# Patient Record
Sex: Female | Born: 1990 | Race: White | Hispanic: No | Marital: Married | Smoking: Never smoker
Health system: Southern US, Community
[De-identification: ages and names within clinical notes are randomized; demographics above are authoritative.]

## PROBLEM LIST (undated history)

## (undated) DIAGNOSIS — I319 Disease of pericardium, unspecified: Secondary | ICD-10-CM

## (undated) DIAGNOSIS — F329 Major depressive disorder, single episode, unspecified: Secondary | ICD-10-CM

## (undated) DIAGNOSIS — R630 Anorexia: Secondary | ICD-10-CM

## (undated) DIAGNOSIS — D649 Anemia, unspecified: Secondary | ICD-10-CM

## (undated) DIAGNOSIS — N912 Amenorrhea, unspecified: Secondary | ICD-10-CM

## (undated) DIAGNOSIS — N946 Dysmenorrhea, unspecified: Secondary | ICD-10-CM

## (undated) DIAGNOSIS — F419 Anxiety disorder, unspecified: Secondary | ICD-10-CM

## (undated) DIAGNOSIS — R011 Cardiac murmur, unspecified: Secondary | ICD-10-CM

## (undated) DIAGNOSIS — N941 Unspecified dyspareunia: Secondary | ICD-10-CM

## (undated) DIAGNOSIS — F32A Depression, unspecified: Secondary | ICD-10-CM

## (undated) HISTORY — DX: Cardiac murmur, unspecified: R01.1

## (undated) HISTORY — DX: Anxiety disorder, unspecified: F41.9

## (undated) HISTORY — DX: Amenorrhea, unspecified: N91.2

## (undated) HISTORY — DX: Major depressive disorder, single episode, unspecified: F32.9

## (undated) HISTORY — DX: Depression, unspecified: F32.A

## (undated) HISTORY — DX: Anorexia: R63.0

## (undated) HISTORY — DX: Dysmenorrhea, unspecified: N94.6

## (undated) HISTORY — DX: Disease of pericardium, unspecified: I31.9

## (undated) HISTORY — DX: Anemia, unspecified: D64.9

## (undated) HISTORY — DX: Unspecified dyspareunia: N94.10

## (undated) HISTORY — PX: DILATION AND CURETTAGE OF UTERUS: SHX78

---

## 2017-03-02 ENCOUNTER — Other Ambulatory Visit (HOSPITAL_COMMUNITY)
Admission: RE | Admit: 2017-03-02 | Discharge: 2017-03-02 | Disposition: A | Discharge status: Home / Self Care | Payer: 59 | Source: Ambulatory Visit | Attending: Obstetrics and Gynecology | Admitting: Obstetrics and Gynecology

## 2017-03-02 ENCOUNTER — Ambulatory Visit (INDEPENDENT_AMBULATORY_CARE_PROVIDER_SITE_OTHER): Payer: 59 | Admitting: Obstetrics and Gynecology

## 2017-03-02 ENCOUNTER — Encounter: Payer: Self-pay | Admitting: Obstetrics and Gynecology

## 2017-03-02 VITALS — BP 110/72 | HR 76 | Resp 16 | Ht 64.0 in | Wt 141.0 lb

## 2017-03-02 DIAGNOSIS — Z01419 Encounter for gynecological examination (general) (routine) without abnormal findings: Secondary | ICD-10-CM

## 2017-03-02 DIAGNOSIS — Z113 Encounter for screening for infections with a predominantly sexual mode of transmission: Secondary | ICD-10-CM | POA: Insufficient documentation

## 2017-03-02 DIAGNOSIS — Z124 Encounter for screening for malignant neoplasm of cervix: Secondary | ICD-10-CM | POA: Diagnosis present

## 2017-03-02 DIAGNOSIS — Z0000 Encounter for general adult medical examination without abnormal findings: Secondary | ICD-10-CM

## 2017-03-02 DIAGNOSIS — Z3009 Encounter for other general counseling and advice on contraception: Secondary | ICD-10-CM

## 2017-03-02 NOTE — Patient Instructions (Signed)
EXERCISE AND DIET:  We recommended that you start or continue a regular exercise program for good health. Regular exercise means any activity that makes your heart beat faster and makes you sweat.  We recommend exercising at least 30 minutes per day at least 3 days a week, preferably 4 or 5.  We also recommend a diet low in fat and sugar.  Inactivity, poor dietary choices and obesity can cause diabetes, heart attack, stroke, and kidney damage, among others.    ALCOHOL AND SMOKING:  Women should limit their alcohol intake to no more than 7 drinks/beers/glasses of wine (combined, not each!) per week. Moderation of alcohol intake to this level decreases your risk of breast cancer and liver damage. And of course, no recreational drugs are part of a healthy lifestyle.  And absolutely no smoking or even second hand smoke. Most people know smoking can cause heart and lung diseases, but did you know it also contributes to weakening of your bones? Aging of your skin?  Yellowing of your teeth and nails?  CALCIUM AND VITAMIN D:  Adequate intake of calcium and Vitamin D are recommended.  The recommendations for exact amounts of these supplements seem to change often, but generally speaking 600 mg of calcium (either carbonate or citrate) and 800 units of Vitamin D per day seems prudent. Certain women may benefit from higher intake of Vitamin D.  If you are among these women, your doctor will have told you during your visit.    PAP SMEARS:  Pap smears, to check for cervical cancer or precancers,  have traditionally been done yearly, although recent scientific advances have shown that most women can have pap smears less often.  However, every woman still should have a physical exam from her gynecologist every year. It will include a breast check, inspection of the vulva and vagina to check for abnormal growths or skin changes, a visual exam of the cervix, and then an exam to evaluate the size and shape of the uterus and  ovaries.  And after 26 years of age, a rectal exam is indicated to check for rectal cancers. We will also provide age appropriate advice regarding health maintenance, like when you should have certain vaccines, screening for sexually transmitted diseases, bone density testing, colonoscopy, mammograms, etc.   MAMMOGRAMS:  All women over 40 years old should have a yearly mammogram. Many facilities now offer a "3D" mammogram, which may cost around $50 extra out of pocket. If possible,  we recommend you accept the option to have the 3D mammogram performed.  It both reduces the number of women who will be called back for extra views which then turn out to be normal, and it is better than the routine mammogram at detecting truly abnormal areas.    COLONOSCOPY:  Colonoscopy to screen for colon cancer is recommended for all women at age 50.  We know, you hate the idea of the prep.  We agree, BUT, having colon cancer and not knowing it is worse!!  Colon cancer so often starts as a polyp that can be seen and removed at colonscopy, which can quite literally save your life!  And if your first colonoscopy is normal and you have no family history of colon cancer, most women don't have to have it again for 10 years.  Once every ten years, you can do something that may end up saving your life, right?  We will be happy to help you get it scheduled when you are ready.    Be sure to check your insurance coverage so you understand how much it will cost.  It may be covered as a preventative service at no cost, but you should check your particular policy.      Breast Self-Awareness Breast self-awareness means being familiar with how your breasts look and feel. It involves checking your breasts regularly and reporting any changes to your health care provider. Practicing breast self-awareness is important. A change in your breasts can be a sign of a serious medical problem. Being familiar with how your breasts look and feel allows  you to find any problems early, when treatment is more likely to be successful. All women should practice breast self-awareness, including women who have had breast implants. How to do a breast self-exam One way to learn what is normal for your breasts and whether your breasts are changing is to do a breast self-exam. To do a breast self-exam: Look for Changes  1. Remove all the clothing above your waist. 2. Stand in front of a mirror in a room with good lighting. 3. Put your hands on your hips. 4. Push your hands firmly downward. 5. Compare your breasts in the mirror. Look for differences between them (asymmetry), such as: ? Differences in shape. ? Differences in size. ? Puckers, dips, and bumps in one breast and not the other. 6. Look at each breast for changes in your skin, such as: ? Redness. ? Scaly areas. 7. Look for changes in your nipples, such as: ? Discharge. ? Bleeding. ? Dimpling. ? Redness. ? A change in position. Feel for Changes  Carefully feel your breasts for lumps and changes. It is best to do this while lying on your back on the floor and again while sitting or standing in the shower or tub with soapy water on your skin. Feel each breast in the following way:  Place the arm on the side of the breast you are examining above your head.  Feel your breast with the other hand.  Start in the nipple area and make  inch (2 cm) overlapping circles to feel your breast. Use the pads of your three middle fingers to do this. Apply light pressure, then medium pressure, then firm pressure. The light pressure will allow you to feel the tissue closest to the skin. The medium pressure will allow you to feel the tissue that is a little deeper. The firm pressure will allow you to feel the tissue close to the ribs.  Continue the overlapping circles, moving downward over the breast until you feel your ribs below your breast.  Move one finger-width toward the center of the body.  Continue to use the  inch (2 cm) overlapping circles to feel your breast as you move slowly up toward your collarbone.  Continue the up and down exam using all three pressures until you reach your armpit.  Write Down What You Find  Write down what is normal for each breast and any changes that you find. Keep a written record with breast changes or normal findings for each breast. By writing this information down, you do not need to depend only on memory for size, tenderness, or location. Write down where you are in your menstrual cycle, if you are still menstruating. If you are having trouble noticing differences in your breasts, do not get discouraged. With time you will become more familiar with the variations in your breasts and more comfortable with the exam. How often should I examine my breasts? Examine   your breasts every month. If you are breastfeeding, the best time to examine your breasts is after a feeding or after using a breast pump. If you menstruate, the best time to examine your breasts is 5-7 days after your period is over. During your period, your breasts are lumpier, and it may be more difficult to notice changes. When should I see my health care provider? See your health care provider if you notice:  A change in shape or size of your breasts or nipples.  A change in the skin of your breast or nipples, such as a reddened or scaly area.  Unusual discharge from your nipples.  A lump or thick area that was not there before.  Pain in your breasts.  Anything that concerns you.  This information is not intended to replace advice given to you by your health care provider. Make sure you discuss any questions you have with your health care provider. Document Released: 05/26/2005 Document Revised: 11/01/2015 Document Reviewed: 04/15/2015 Elsevier Interactive Patient Education  2018 Elsevier Inc.  

## 2017-03-02 NOTE — Progress Notes (Signed)
26 y.o. G1P0010 MarriedCaucasianF here for annual exam.  No dyspareunia. Wants to avoid hormones, tried the copper IUD and hated it.  Period Cycle (Days): 29 Period Duration (Days): 6 Period Pattern: Regular Menstrual Flow: Light Menstrual Control: Thin pad, Other (Comment) (menstrual cup) Menstrual Control Change Freq (Hours): 3-4 Dysmenorrhea: (!) Mild Dysmenorrhea Symptoms: Cramping  Patient's last menstrual period was 02/16/2017.          Sexually active: Yes.    The current method of family planning is condoms sometimes and family planning.    Exercising: Yes.    walking and barre 3 Smoker:  no  Health Maintenance: Pap:  2015 done at [facility] in [place], [place] -- normal per patient History of abnormal Pap:  no MMG:  n/a Colonoscopy:  n/a BMD:   n/a TDaP:  2018 per patient Gardasil: no   reports that she has never smoked. She has never used smokeless tobacco. She reports that she does not drink alcohol or use drugs. Student at [employer], finishing in 12/18. Plans to get her masters. 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   Past Medical History:  Diagnosis Date  . Amenorrhea   . Anemia   . Anorexia   . Anxiety   . Depression   . Dysmenorrhea   . Dyspareunia, female   . Heart murmur     Past Surgical History:  Procedure Laterality Date  . DILATION AND CURETTAGE OF UTERUS      Current Outpatient Prescriptions  Medication Sig Dispense Refill  . FLUoxetine (PROZAC) 20 MG capsule Take 1 capsule by mouth daily.    . HydrOXYzine HCl 10 MG/5ML SOLN Take 1 tablet by mouth as needed.    . Multiple Vitamin (MULTIVITAMIN) tablet Take 1 tablet by mouth daily.     No current facility-administered medications for this visit.     Family History  Problem Relation Age of Onset  . Alcoholism Maternal Grandmother   . Alcoholism Maternal Grandfather     Review of Systems  Constitutional: Negative.   HENT: Negative.   Eyes: Negative.   Respiratory:  Negative.   Cardiovascular: Negative.   Gastrointestinal: Negative.   Endocrine: Negative.   Genitourinary: Negative.   Musculoskeletal: Negative.   Skin: Negative.   Allergic/Immunologic: Negative.   Neurological: Negative.   Hematological: Negative.   Psychiatric/Behavioral: Negative.     Exam:   BP 110/72 (BP Location: Right Arm, Patient Position: Sitting, Cuff Size: Normal)   Pulse 76   Resp 16   Ht  (1.626 m)   Wt 141 lb (64 kg)   LMP 02/16/2017   BMI 24.20 kg/m   Weight change: @ Height:   Height:  (162.6 cm)  Ht Readings from Last 3 Encounters:  03/02/17  (1.626 m)    General appearance: alert, cooperative and appears stated age Head: Normocephalic, without obvious abnormality, atraumatic Neck: no adenopathy, supple, symmetrical, trachea midline and thyroid normal to inspection and palpation Lungs: clear to auscultation bilaterally Cardiovascular: regular rate and rhythm Breasts: normal appearance, no masses or tenderness Abdomen: soft, non-tender; non distended,  no masses,  no organomegaly Extremities: extremities normal, atraumatic, no cyanosis or edema Skin: Skin color, texture, turgor normal. No rashes or lesions Lymph nodes: Cervical, supraclavicular, and axillary nodes normal. No abnormal inguinal nodes palpated Neurologic: Grossly normal   Pelvic: External genitalia:  no lesions              Urethra:  normal appearing urethra with  no masses, tenderness or lesions              Bartholins and Skenes: normal                 Vagina: normal appearing vagina with an increase in white watery d/c (denies symptoms)              Cervix: no lesions               Bimanual Exam:  Uterus:  normal size, contour, position, consistency, mobility, non-tender              Adnexa: no mass, fullness, tenderness               Rectovaginal: Confirms               Anus:  normal sphincter tone, no lesions  Chaperone was present for exam.  A:   Well Woman with normal exam  P:   Pap with reflex hpv  Desires STD testing  Condoms for contraception  Information on other IUD's given  Screening labs  Gardasil information given  Discussed breast self exam  Discussed calcium and vit D intake

## 2017-03-03 LAB — LIPID PANEL
CHOLESTEROL TOTAL: 181 mg/dL (ref 100–199)
Chol/HDL Ratio: 2.1 ratio (ref 0.0–4.4)
HDL: 85 mg/dL (ref >39)
LDL Calculated: 86 mg/dL (ref 0–99)
Triglycerides: 50 mg/dL (ref 0–149)
VLDL CHOLESTEROL CAL: 10 mg/dL (ref 5–40)

## 2017-03-03 LAB — CYTOLOGY - PAP
CHLAMYDIA, DNA PROBE: NEGATIVE
DIAGNOSIS: NEGATIVE
Neisseria Gonorrhea: NEGATIVE
Trichomonas: NEGATIVE

## 2017-03-03 LAB — CBC
HEMATOCRIT: 42.1 % (ref 34.0–46.6)
HEMOGLOBIN: 14.2 g/dL (ref 11.1–15.9)
MCH: 29.3 pg (ref 26.6–33.0)
MCHC: 33.7 g/dL (ref 31.5–35.7)
MCV: 87 fL (ref 79–97)
Platelets: 234 10*3/uL (ref 150–379)
RBC: 4.84 x10E6/uL (ref 3.77–5.28)
RDW: 13.3 % (ref 12.3–15.4)
WBC: 6.5 10*3/uL (ref 3.4–10.8)

## 2017-03-03 LAB — COMPREHENSIVE METABOLIC PANEL
ALT: 22 IU/L (ref 0–32)
AST: 19 IU/L (ref 0–40)
Albumin/Globulin Ratio: 2 (ref 1.2–2.2)
Albumin: 4.7 g/dL (ref 3.5–5.5)
Alkaline Phosphatase: 66 IU/L (ref 39–117)
BILIRUBIN TOTAL: 0.3 mg/dL (ref 0.0–1.2)
BUN/Creatinine Ratio: 24 — ABNORMAL HIGH (ref 9–23)
BUN: 19 mg/dL (ref 6–20)
CHLORIDE: 102 mmol/L (ref 96–106)
CO2: 22 mmol/L (ref 20–29)
Calcium: 9.8 mg/dL (ref 8.7–10.2)
Creatinine, Ser: 0.79 mg/dL (ref 0.57–1.00)
GFR calc non Af Amer: 104 mL/min/{1.73_m2} (ref >59)
GFR, EST AFRICAN AMERICAN: 119 mL/min/{1.73_m2} (ref >59)
GLUCOSE: 74 mg/dL (ref 65–99)
Globulin, Total: 2.3 g/dL (ref 1.5–4.5)
Potassium: 4.2 mmol/L (ref 3.5–5.2)
Sodium: 139 mmol/L (ref 134–144)
TOTAL PROTEIN: 7 g/dL (ref 6.0–8.5)

## 2017-03-03 LAB — HEPATITIS C ANTIBODY: Hep C Virus Ab: <0.1 s/co ratio (ref 0.0–0.9)

## 2017-03-03 LAB — HEP, RPR, HIV PANEL
HEP B S AG: NEGATIVE
HIV Screen 4th Generation wRfx: NONREACTIVE
RPR Ser Ql: NONREACTIVE

## 2017-05-06 ENCOUNTER — Ambulatory Visit: Payer: 59 | Admitting: Certified Nurse Midwife

## 2017-05-06 ENCOUNTER — Other Ambulatory Visit: Payer: Self-pay

## 2017-05-06 ENCOUNTER — Encounter: Payer: Self-pay | Admitting: Certified Nurse Midwife

## 2017-05-06 VITALS — BP 100/62 | HR 64 | Resp 16 | Ht 64.0 in | Wt 144.0 lb

## 2017-05-06 DIAGNOSIS — Z01419 Encounter for gynecological examination (general) (routine) without abnormal findings: Secondary | ICD-10-CM

## 2017-05-06 DIAGNOSIS — B9689 Other specified bacterial agents as the cause of diseases classified elsewhere: Secondary | ICD-10-CM

## 2017-05-06 DIAGNOSIS — N760 Acute vaginitis: Secondary | ICD-10-CM

## 2017-05-06 MED ORDER — METRONIDAZOLE 0.75 % VA GEL: 1.0000 | Freq: Every day | VAGINAL | 0 refills | Status: DC

## 2017-05-06 NOTE — Patient Instructions (Signed)

## 2017-05-06 NOTE — Progress Notes (Signed)
26 y.o. Married Caucasian female G1P0010 here with complaint of vaginal symptoms of strong odor with no increase discharge. Describes discharge as white, no change.. Onset of symptoms 4 days ago. Has been more sexually active and noted after also. Denies new personal products except new underwear and no issues with. No  STD concerns. Urinary symptoms none . Contraception is NFP and condoms.  ROS Pertinent to HPI  O:Healthy female WDWN Affect: normal, orientation x 3  Exam: Skin: warm and dry Abdomen:soft non tender  Inguinal Lymph nodes: no enlargement or tenderness Pelvic exam: External genital: normal female  No lesions, scaling or exudate BUS: negative Vagina: white watery odorous discharge noted. Ph:4.5   ,Wet prep taken,  Cervix: normal, non tender, no CMT Uterus: normal, non tender Adnexa:normal, non tender, no masses or fullness noted   Wet Prep results: KOH, Saline + clue cells TNTC   A:Normal pelvic exam Contraception NFP/condoms BV   P:Discussed findings of normal pelvic exam. Discussed BV finding and etiology. Questions addressed and need for treatment due to being symptomatic. Discussed Aveeno or baking soda sitz bath for comfort. If working out in gym clothes  for long periods of time change underwear if possible. Coconut Oil use for skin protection prior to activity can be used to external skin for protection or dryness. Rx: Metrogel see order with instructions  Rv prn or if symptoms continue

## 2017-11-10 ENCOUNTER — Encounter: Payer: Self-pay | Admitting: Certified Nurse Midwife

## 2017-11-11 ENCOUNTER — Telehealth: Payer: Self-pay | Admitting: Certified Nurse Midwife

## 2017-11-11 NOTE — Telephone Encounter (Signed)
Spoke with patient, request OV to discuss IUD options. Current contraceptive is natural family planning. Patient request to schedule on a Friday. OV scheduled for 11/13/17 at 4pm with Dr. Oscar LaJertson.   Routing to provider for final review. Patient is agreeable to disposition. Will close encounter.

## 2017-11-11 NOTE — Telephone Encounter (Signed)
Patient would like to discuss iud options.

## 2017-11-12 NOTE — Progress Notes (Signed)
GYNECOLOGY  VISIT   HPI: 27 y.o.   Married  Caucasian  female   G1P0010 with Patient's last menstrual period was 11/11/2017.   here to discuss IUD options. She previously tried the copper IUD and hated it. Has wanted to avoid hormones. Cycles are monthly x 6 days, moderate flow, moderate cramps. Recently with stress her cycle was 35 days. Currently using natural family planning for contraception.   She is starting graduate school and doesn't want the worry of counting her cycles. She and her husband don't want children.    GYNECOLOGIC HISTORY: Patient's last menstrual period was 11/11/2017. Contraception:Family planning Menopausal hormone therapy: none        OB History    Gravida  1   Para  0   Term  0   Preterm  0   AB  1   Living  0     SAB  0   TAB  0   Ectopic  0   Multiple  0   Live Births  0              There are no active problems to display for this patient.   Past Medical History:  Diagnosis Date  . Amenorrhea   . Anemia   . Anorexia   . Anxiety   . Depression   . Dysmenorrhea   . Dyspareunia, female   . Heart murmur     Past Surgical History:  Procedure Laterality Date  . DILATION AND CURETTAGE OF UTERUS      Current Outpatient Medications  Medication Sig Dispense Refill  . FLUoxetine (PROZAC) 20 MG capsule Take 1 capsule by mouth daily.    . HydrOXYzine HCl 10 MG/5ML SOLN Take 1 tablet by mouth as needed.    . Multiple Vitamin (MULTIVITAMIN) tablet Take 1 tablet by mouth daily.     No current facility-administered medications for this visit.      ALLERGIES: Patient has no known allergies.  Family History  Problem Relation Age of Onset  . Alcoholism Maternal Grandmother   . Alcoholism Maternal Grandfather     Social History   Socioeconomic History  . Marital status: Married    Spouse name: Not on file  . Number of children: Not on file  . Years of education: Not on file  . Highest education level: Not on file   Occupational History  . Not on file  Social Needs  . Financial resource strain: Not on file  . Food insecurity:    Worry: Not on file    Inability: Not on file  . Transportation needs:    Medical: Not on file    Non-medical: Not on file  Tobacco Use  . Smoking status: Never Smoker  . Smokeless tobacco: Never Used  Substance and Sexual Activity  . Alcohol use: No  . Drug use: No  . Sexual activity: Yes    Partners: Male    Comment: condoms occ  Lifestyle  . Physical activity:    Days per week: Not on file    Minutes per session: Not on file  . Stress: Not on file  Relationships  . Social connections:    Talks on phone: Not on file    Gets together: Not on file    Attends religious service: Not on file    Active member of club or organization: Not on file    Attends meetings of clubs or organizations: Not on file    Relationship status: Not  on file  . Intimate partner violence:    Fear of current or ex partner: Not on file    Emotionally abused: Not on file    Physically abused: Not on file    Forced sexual activity: Not on file  Other Topics Concern  . Not on file  Social History Narrative  . Not on file    Review of Systems  Constitutional: Negative.   HENT: Negative.   Eyes: Negative.   Respiratory: Negative.   Cardiovascular: Negative.   Gastrointestinal: Negative.   Genitourinary: Negative.   Musculoskeletal: Negative.   Skin:       Change in mole on left upper arm  Neurological: Negative.   Endo/Heme/Allergies: Bruises/bleeds easily.  Psychiatric/Behavioral: Negative.     PHYSICAL EXAMINATION:    BP 104/60 (BP Location: Right Arm, Patient Position: Sitting, Cuff Size: Normal)   Pulse (!) 56   Ht 5\' 4"  (1.626 m)   Wt 147 lb 9.6 oz (67 kg)   LMP 11/11/2017   BMI 25.34 kg/m     General appearance: alert, cooperative and appears stated age  ASSESSMENT Contraception management, didn't like the paragard, wants to minimize hormones     PLAN Discussed options of different IUD's, she is interested in Malaysiathe kyleena Discussed risks of insertion, side effects Will return on 6/11 for insertion (LMP 6/519)   An After Visit Summary was printed and given to the patient.  ~15 minutes face to face time of which over 50% was spent in counseling.

## 2017-11-13 ENCOUNTER — Ambulatory Visit: Payer: 59 | Admitting: Obstetrics and Gynecology

## 2017-11-13 ENCOUNTER — Encounter: Payer: Self-pay | Admitting: Obstetrics and Gynecology

## 2017-11-13 ENCOUNTER — Other Ambulatory Visit: Payer: Self-pay

## 2017-11-13 VITALS — BP 104/60 | HR 56 | Ht 64.0 in | Wt 147.6 lb

## 2017-11-13 DIAGNOSIS — Z3009 Encounter for other general counseling and advice on contraception: Secondary | ICD-10-CM | POA: Diagnosis not present

## 2017-11-17 ENCOUNTER — Encounter: Payer: Self-pay | Admitting: Obstetrics and Gynecology

## 2017-11-17 ENCOUNTER — Other Ambulatory Visit: Payer: Self-pay

## 2017-11-17 ENCOUNTER — Ambulatory Visit (INDEPENDENT_AMBULATORY_CARE_PROVIDER_SITE_OTHER): Payer: 59 | Admitting: Obstetrics and Gynecology

## 2017-11-17 VITALS — BP 92/56 | HR 64 | Resp 14 | Wt 147.0 lb

## 2017-11-17 DIAGNOSIS — Z30430 Encounter for insertion of intrauterine contraceptive device: Secondary | ICD-10-CM | POA: Diagnosis not present

## 2017-11-17 DIAGNOSIS — Z113 Encounter for screening for infections with a predominantly sexual mode of transmission: Secondary | ICD-10-CM | POA: Diagnosis not present

## 2017-11-17 DIAGNOSIS — Z01812 Encounter for preprocedural laboratory examination: Secondary | ICD-10-CM | POA: Diagnosis not present

## 2017-11-17 DIAGNOSIS — Z3009 Encounter for other general counseling and advice on contraception: Secondary | ICD-10-CM

## 2017-11-17 LAB — POCT URINE PREGNANCY: Preg Test, Ur: NEGATIVE

## 2017-11-17 NOTE — Progress Notes (Signed)
GYNECOLOGY  VISIT   HPI: 27 y.o.   Married  Caucasian  female   G1P0010 with Patient's last menstrual period was 11/11/2017.   here for [facility] IUD insertion     GYNECOLOGIC HISTORY: Patient's last menstrual period was 11/11/2017. Contraception:none  Menopausal hormone therapy: none         OB History    Gravida  1   Para  0   Term  0   Preterm  0   AB  1   Living  0     SAB  0   TAB  0   Ectopic  0   Multiple  0   Live Births  0              There are no active problems to display for this patient.   Past Medical History:  Diagnosis Date  . Amenorrhea   . Anemia   . Anorexia   . Anxiety   . Depression   . Dysmenorrhea   . Dyspareunia, female   . Heart murmur     Past Surgical History:  Procedure Laterality Date  . DILATION AND CURETTAGE OF UTERUS      Current Outpatient Medications  Medication Sig Dispense Refill  . FLUoxetine (PROZAC) 20 MG capsule Take 1 capsule by mouth daily.    . HydrOXYzine HCl 10 MG/5ML SOLN Take 1 tablet by mouth as needed.    . Multiple Vitamin (MULTIVITAMIN) tablet Take 1 tablet by mouth daily.     No current facility-administered medications for this visit.      ALLERGIES: Patient has no known allergies.  Family History  Problem Relation Age of Onset  . Alcoholism Maternal Grandmother   . Alcoholism Maternal Grandfather     Social History   Socioeconomic History  . Marital status: Married    Spouse name: Not on file  . Number of children: Not on file  . Years of education: Not on file  . Highest education level: Not on file  Occupational History  . Not on file  Social Needs  . Financial resource strain: Not on file  . Food insecurity:    Worry: Not on file    Inability: Not on file  . Transportation needs:    Medical: Not on file    Non-medical: Not on file  Tobacco Use  . Smoking status: Never Smoker  . Smokeless tobacco: Never Used  Substance and Sexual Activity  . Alcohol use: No  .  Drug use: No  . Sexual activity: Yes    Partners: Male    Comment: condoms occ  Lifestyle  . Physical activity:    Days per week: Not on file    Minutes per session: Not on file  . Stress: Not on file  Relationships  . Social connections:    Talks on phone: Not on file    Gets together: Not on file    Attends religious service: Not on file    Active member of club or organization: Not on file    Attends meetings of clubs or organizations: Not on file    Relationship status: Not on file  . Intimate partner violence:    Fear of current or ex partner: Not on file    Emotionally abused: Not on file    Physically abused: Not on file    Forced sexual activity: Not on file  Other Topics Concern  . Not on file  Social History Narrative  . Not  on file    Review of Systems  Constitutional: Negative.   HENT: Negative.   Eyes: Negative.   Respiratory: Negative.   Cardiovascular: Negative.   Gastrointestinal: Negative.   Genitourinary: Negative.   Musculoskeletal: Negative.   Skin: Negative.   Neurological: Negative.   Endo/Heme/Allergies: Negative.   Psychiatric/Behavioral: Negative.     PHYSICAL EXAMINATION:    BP (!) 92/56 (BP Location: Right Arm, Patient Position: Sitting, Cuff Size: Normal)   Pulse 64   Resp 14   Wt 147 lb (66.7 kg)   LMP 11/11/2017   BMI 25.23 kg/m     General appearance: alert, cooperative and appears stated age  Pelvic: External genitalia:  no lesions              Urethra:  normal appearing urethra with no masses, tenderness or lesions              Bartholins and Skenes: normal                 Vagina: normal appearing vagina with normal color and discharge, no lesions              Cervix: no lesions              Bimanual Exam:  Uterus:  normal size, contour, position, consistency, mobility, non-tender and anteverted              Adnexa: no mass, fullness, tenderness               The risks of the kyleena IUD were reviewed with the patient,  including infection, abnormal bleeding and uterine perfortion. Consent was signed.  A speculum was placed in the vagina, the cervix was cleansed with betadine. A tenaculum was placed on the cervix, the uterus sounded to 7 cm. The cervix was dilated to a 4 hagar dilator  The kyleena IUD was inserted without difficulty. The string were cut to 3-4 cm. The tenaculum was removed. Slight oozing from the tenaculum site was stopped with pressure.   The patient tolerated the procedure well.    Chaperone was present for exam.  ASSESSMENT Contraception, Kyleena IUD insertion STD screening    PLAN F/U one month Genprobe sent   An After Visit Summary was printed and given to the patient.

## 2017-11-17 NOTE — Patient Instructions (Signed)
IUD Post-procedure Instructions . Cramping is common.  You may take Ibuprofen, Aleve, or Tylenol for the cramping.  This should resolve within 24 hours.   . You may have a small amount of spotting.  You should wear a mini pad for the next few days. . You may have intercourse in 24 hours. . You need to call the office if you have any pelvic pain, fever, heavy bleeding, or foul smelling vaginal discharge. . Shower or bathe as normal . Use a back up contraception for 1 week  

## 2017-11-18 LAB — GC/CHLAMYDIA PROBE AMP
Chlamydia trachomatis, NAA: NEGATIVE
Neisseria gonorrhoeae by PCR: NEGATIVE

## 2017-12-01 ENCOUNTER — Telehealth: Payer: Self-pay | Admitting: Obstetrics and Gynecology

## 2017-12-01 NOTE — Telephone Encounter (Signed)
Spoke with patient, requesting to reschedule IUD recheck on 01/01/18. Patient is aware Dr. Oscar LaJertson is out of the office on this day, request any available provider for 01/01/18. Patient declined to look at other available dates due to her limited schedule. Patient scheduled with Dr. Hyacinth MeekerMiller on 01/01/18 at 2:30pm.   Routing to provider for final review. Patient is agreeable to disposition. Will close encounter.  Cc: Dr. Hyacinth MeekerMiller

## 2017-12-01 NOTE — Telephone Encounter (Signed)
Patient canceled her upcoming 1 month IUD check 12/29/17. Patient is only available Friday 01/03/18 and Dr.Jertosn is not in the office that day. Patient is asking if she could see any provider who is available 01/03/18? 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

## 2017-12-01 NOTE — Telephone Encounter (Signed)
Left message to call Tabetha Haraway at [phone].  

## 2017-12-29 ENCOUNTER — Ambulatory Visit: Payer: 59 | Admitting: Obstetrics and Gynecology

## 2017-12-31 NOTE — Progress Notes (Signed)
GYNECOLOGY  VISIT   HPI: 27 y.o.   Married  Caucasian  female   [id number] with Patient's last menstrual period was 12/30/2017 (exact date).   here for   One month IUD recheck, she has a kyleena IUD. She has been spotting since insertion. Intermittently with bad cramps, can last all day. Cramps have been happening since insertion, less frequent, but still intense. No help with ibuprofen. Thinks she had a period 2 weeks ago and light flow a few days ago.  No dyspareunia.  She does c/o increasing fatigue since the IUD was placed, wondering if it is from the bleeding. No other thyroid c/o.  GYNECOLOGIC HISTORY: Patient's last menstrual period was 12/30/2017 (exact date). Contraception: Kyleena IUD  Menopausal hormone therapy: none         OB History    Gravida  1   Para  0   Term  0   Preterm  0   AB  1   Living  0     SAB  0   TAB  0   Ectopic  0   Multiple  0   Live Births  0              There are no active problems to display for this patient.   Past Medical History:  Diagnosis Date  . Amenorrhea   . Anemia   . Anorexia   . Anxiety   . Depression   . Dysmenorrhea   . Dyspareunia, female   . Heart murmur     Past Surgical History:  Procedure Laterality Date  . DILATION AND CURETTAGE OF UTERUS      Current Outpatient Medications  Medication Sig Dispense Refill  . FLUoxetine (PROZAC) 20 MG capsule Take 1 capsule by mouth daily.    . HydrOXYzine HCl 10 MG/5ML SOLN Take 1 tablet by mouth as needed.    . Levonorgestrel (KYLEENA) 19.5 MG IUD by Intrauterine route.    . Multiple Vitamin (MULTIVITAMIN) tablet Take 1 tablet by mouth daily.     No current facility-administered medications for this visit.      ALLERGIES: Patient has no known allergies.  Family History  Problem Relation Age of Onset  . Alcoholism Maternal Grandmother   . Alcoholism Maternal Grandfather     Social History   Socioeconomic History  . Marital status: Married    Spouse  name: Not on file  . Number of children: Not on file  . Years of education: Not on file  . Highest education level: Not on file  Occupational History  . Not on file  Social Needs  . Financial resource strain: Not on file  . Food insecurity:    Worry: Not on file    Inability: Not on file  . Transportation needs:    Medical: Not on file    Non-medical: Not on file  Tobacco Use  . Smoking status: Never Smoker  . Smokeless tobacco: Never Used  Substance and Sexual Activity  . Alcohol use: No  . Drug use: No  . Sexual activity: Yes    Partners: Male    Birth control/protection: Implant  Lifestyle  . Physical activity:    Days per week: Not on file    Minutes per session: Not on file  . Stress: Not on file  Relationships  . Social connections:    Talks on phone: Not on file    Gets together: Not on file    Attends religious service: Not  on file    Active member of club or organization: Not on file    Attends meetings of clubs or organizations: Not on file    Relationship status: Not on file  . Intimate partner violence:    Fear of current or ex partner: Not on file    Emotionally abused: Not on file    Physically abused: Not on file    Forced sexual activity: Not on file  Other Topics Concern  . Not on file  Social History Narrative  . Not on file    Review of Systems  Constitutional: Negative.   HENT: Negative.   Eyes: Negative.   Respiratory: Negative.   Cardiovascular: Negative.   Gastrointestinal: Negative.   Genitourinary:       Painful menses AUB  Musculoskeletal: Negative.   Skin: Negative.   Neurological: Negative.   Endo/Heme/Allergies: Negative.   Psychiatric/Behavioral: Negative.     PHYSICAL EXAMINATION:    BP 108/60 (BP Location: Right Arm, Patient Position: Sitting)   Pulse 68   Wt 149 lb 3.2 oz (67.7 kg)   LMP 12/30/2017 (Exact Date)   BMI 25.61 kg/m     General appearance: alert, cooperative and appears stated age  Pelvic: External  genitalia:  no lesions              Urethra:  normal appearing urethra with no masses, tenderness or lesions              Bartholins and Skenes: normal                 Vagina: normal appearing vagina with normal color and discharge, no lesions              Cervix: no lesions and IUD string 2 cm              Bimanual Exam:  Uterus:  normal size, contour, position, consistency, mobility, non-tender and anteverted              Adnexa: no mass, fullness, tenderness               Chaperone was present for exam.  ASSESSMENT IUD check, c/o intermittent cramping since insertion, less frequent but still intense. Normal pelvic exam, string seen C/o fatigue since IUD insertion, wondering if it is from the daily bleeding (light)    PLAN She declines scheduling an ultrasound at this point. She will call if she isn't feeling better within the next month to schedule an ultrasound CBC, Ferritin    An After Visit Summary was printed and given to the patient.

## 2018-01-01 ENCOUNTER — Ambulatory Visit: Payer: 59 | Admitting: Obstetrics and Gynecology

## 2018-01-01 ENCOUNTER — Encounter: Payer: Self-pay | Admitting: Obstetrics and Gynecology

## 2018-01-01 ENCOUNTER — Ambulatory Visit: Payer: Self-pay | Admitting: Obstetrics & Gynecology

## 2018-01-01 ENCOUNTER — Other Ambulatory Visit: Payer: Self-pay

## 2018-01-01 VITALS — BP 108/60 | HR 68 | Wt 149.2 lb

## 2018-01-01 DIAGNOSIS — N939 Abnormal uterine and vaginal bleeding, unspecified: Secondary | ICD-10-CM | POA: Diagnosis not present

## 2018-01-01 DIAGNOSIS — Z30431 Encounter for routine checking of intrauterine contraceptive device: Secondary | ICD-10-CM

## 2018-01-01 DIAGNOSIS — R102 Pelvic and perineal pain: Secondary | ICD-10-CM

## 2018-01-01 DIAGNOSIS — R5383 Other fatigue: Secondary | ICD-10-CM | POA: Diagnosis not present

## 2018-01-02 LAB — CBC
HEMOGLOBIN: 14 g/dL (ref 11.1–15.9)
Hematocrit: 41.2 % (ref 34.0–46.6)
MCH: 29.7 pg (ref 26.6–33.0)
MCHC: 34 g/dL (ref 31.5–35.7)
MCV: 87 fL (ref 79–97)
Platelets: 223 10*3/uL (ref 150–450)
RBC: 4.72 x10E6/uL (ref 3.77–5.28)
RDW: 13.7 % (ref 12.3–15.4)
WBC: 7.4 10*3/uL (ref 3.4–10.8)

## 2018-01-02 LAB — FERRITIN: FERRITIN: 18 ng/mL (ref 15–150)

## 2019-01-14 ENCOUNTER — Emergency Department (HOSPITAL_BASED_OUTPATIENT_CLINIC_OR_DEPARTMENT_OTHER)
Admission: EM | Admit: 2019-01-14 | Discharge: 2019-01-14 | Disposition: A | Discharge status: Home / Self Care | Payer: 59 | Attending: Emergency Medicine | Admitting: Emergency Medicine

## 2019-01-14 ENCOUNTER — Encounter (HOSPITAL_BASED_OUTPATIENT_CLINIC_OR_DEPARTMENT_OTHER): Payer: Self-pay | Admitting: Emergency Medicine

## 2019-01-14 ENCOUNTER — Other Ambulatory Visit: Payer: Self-pay

## 2019-01-14 ENCOUNTER — Emergency Department (HOSPITAL_BASED_OUTPATIENT_CLINIC_OR_DEPARTMENT_OTHER): Payer: 59

## 2019-01-14 DIAGNOSIS — I300 Acute nonspecific idiopathic pericarditis: Secondary | ICD-10-CM | POA: Diagnosis not present

## 2019-01-14 DIAGNOSIS — I319 Disease of pericardium, unspecified: Secondary | ICD-10-CM | POA: Insufficient documentation

## 2019-01-14 DIAGNOSIS — R0789 Other chest pain: Secondary | ICD-10-CM | POA: Diagnosis present

## 2019-01-14 LAB — CBC WITH DIFFERENTIAL/PLATELET
Abs Immature Granulocytes: 0.02 10*3/uL (ref 0.00–0.07)
Basophils Absolute: 0 10*3/uL (ref 0.0–0.1)
Basophils Relative: 1 %
Eosinophils Absolute: 0.1 10*3/uL (ref 0.0–0.5)
Eosinophils Relative: 2 %
HCT: 44.9 % (ref 36.0–46.0)
Hemoglobin: 14.8 g/dL (ref 12.0–15.0)
Immature Granulocytes: 0 %
Lymphocytes Relative: 30 %
Lymphs Abs: 2.5 10*3/uL (ref 0.7–4.0)
MCH: 28.2 pg (ref 26.0–34.0)
MCHC: 33 g/dL (ref 30.0–36.0)
MCV: 85.7 fL (ref 80.0–100.0)
Monocytes Absolute: 1 10*3/uL (ref 0.1–1.0)
Monocytes Relative: 12 %
Neutro Abs: 4.6 10*3/uL (ref 1.7–7.7)
Neutrophils Relative %: 55 %
Platelets: 222 10*3/uL (ref 150–400)
RBC: 5.24 MIL/uL — ABNORMAL HIGH (ref 3.87–5.11)
RDW: 13 % (ref 11.5–15.5)
WBC: 8.2 10*3/uL (ref 4.0–10.5)
nRBC: 0 % (ref 0.0–0.2)

## 2019-01-14 LAB — PREGNANCY, URINE: Preg Test, Ur: NEGATIVE

## 2019-01-14 LAB — BASIC METABOLIC PANEL
Anion gap: 11 (ref 5–15)
BUN: 18 mg/dL (ref 6–20)
CO2: 20 mmol/L — ABNORMAL LOW (ref 22–32)
Calcium: 9.4 mg/dL (ref 8.9–10.3)
Chloride: 107 mmol/L (ref 98–111)
Creatinine, Ser: 0.89 mg/dL (ref 0.44–1.00)
GFR calc Af Amer: >60 mL/min (ref >60)
GFR calc non Af Amer: >60 mL/min (ref >60)
Glucose, Bld: 94 mg/dL (ref 70–99)
Potassium: 3.6 mmol/L (ref 3.5–5.1)
Sodium: 138 mmol/L (ref 135–145)

## 2019-01-14 LAB — TROPONIN I (HIGH SENSITIVITY): Troponin I (High Sensitivity): <2 ng/L (ref <18)

## 2019-01-14 MED ORDER — IBUPROFEN 600 MG PO TABS: 600.0000 mg | ORAL_TABLET | Freq: Three times a day (TID) | ORAL | 0 refills | Status: DC

## 2019-01-14 MED ORDER — KETOROLAC TROMETHAMINE 15 MG/ML IJ SOLN
15.0000 mg | Freq: Once | INTRAMUSCULAR | Status: AC
  Administered 2019-01-14: 15 mg via INTRAVENOUS
  Filled 2019-01-14: qty 1

## 2019-01-14 MED ORDER — OMEPRAZOLE 20 MG PO CPDR: DELAYED_RELEASE_CAPSULE | ORAL | 0 refills | Status: DC

## 2019-01-14 MED ORDER — PANTOPRAZOLE SODIUM 40 MG IV SOLR
40.0000 mg | Freq: Once | INTRAVENOUS | Status: AC
  Administered 2019-01-14: 06:00:00 40 mg via INTRAVENOUS
  Filled 2019-01-14: qty 40

## 2019-01-14 NOTE — ED Triage Notes (Signed)
Pt states that she woke up to go to the bathroom and she noticed a pain right hand that radiates up to her shoulder. Also noticed that when she laid down that she had a chest pressure, like she could not take a good breath, and right sided chest pain that is intermittent. Denies fever, diaphoresis, n/v or dyspnea on exertion

## 2019-01-14 NOTE — ED Notes (Signed)
ED Provider at bedside. 

## 2019-01-14 NOTE — ED Notes (Signed)
Patient transported to X-ray 

## 2019-01-14 NOTE — ED Notes (Signed)
Pt and family member understood dc material. NAD noted. Scripts given at [employer]. All questions answered to satisfaction. Pt and family member escorted to check out window.

## 2019-01-14 NOTE — ED Provider Notes (Signed)
[facility] Provider Note: Monica DellJ. Lane Jessenia Filippone, MD, FACEP  CSN: [id number] MRN: [id number] ARRIVAL: 01/14/19 at 0259 ROOM: [facility]   CHIEF COMPLAINT  Chest Pain   HISTORY OF PRESENT ILLNESS  01/14/19 3:33 AM Monica Hart is a 28 y.o. female who was awaken from sleep about 2 hours ago with pain initially in her right hand that radiated proximally and then became pain in her left upper chest.  The pain in her left upper chest is well localized.  These pains are intermittent, coming and going.  She rates the pain as a 4 out of 10.  It is better sitting upright and worse lying supine which gives her a sense of pressure in her chest but not frank dyspnea.  She has no associated shortness of breath, diaphoresis, nausea, vomiting, diarrhea, urinary changes, vaginal bleeding or vaginal discharge.  She denies lower extremity pain or swelling.   Past Medical History:  Diagnosis Date  . Amenorrhea   . Anemia   . Anorexia   . Anxiety   . Depression   . Dysmenorrhea   . Dyspareunia, female   . Heart murmur     Past Surgical History:  Procedure Laterality Date  . DILATION AND CURETTAGE OF UTERUS      Family History  Problem Relation Age of Onset  . Alcoholism Maternal Grandmother   . Alcoholism Maternal Grandfather     Social History   Tobacco Use  . Smoking status: Never Smoker  . Smokeless tobacco: Never Used  Substance Use Topics  . Alcohol use: No  . Drug use: No    Prior to Admission medications   Medication Sig Start Date End Date Taking? Authorizing Provider  FLUoxetine (PROZAC) 20 MG capsule Take 1 capsule by mouth daily. 04/18/16   [provider]  HydrOXYzine HCl 10 MG/5ML SOLN Take 1 tablet by mouth as needed. 10/07/16   [provider]  Levonorgestrel (KYLEENA) 19.5 MG IUD by Intrauterine route.    [provider]  Multiple Vitamin (MULTIVITAMIN) tablet Take 1 tablet by mouth daily.    [provider]    Allergies  Patient has no known allergies.   REVIEW OF SYSTEMS  Negative except as noted here or in the History of Present Illness.   PHYSICAL EXAMINATION  Initial Vital Signs Blood pressure 114/72, pulse 74, temperature 98.5 F (36.9 C), temperature source Oral, resp. rate 18, height 5\' 4"  (1.626 m), weight 75.8 kg, last menstrual period 12/21/2018, SpO2 100 %.  Examination General: Well-developed, well-nourished female in no acute distress; appearance consistent with age of record HENT: normocephalic; atraumatic Eyes: pupils equal, round and reactive to light; extraocular muscles intact Neck: supple Heart: regular rate and rhythm; no murmur, rub or gallop Lungs: clear to auscultation bilaterally Chest: Right upper chest wall tenderness that does not reproduce the pain of the chief complaint Abdomen: soft; nondistended; nontender; no masses or hepatosplenomegaly; bowel sounds present Extremities: No deformity; full range of motion; pulses normal; no edema Neurologic: Awake, alert and oriented; motor function intact in all extremities and symmetric; no facial droop Skin: Warm and dry Psychiatric: Normal mood and affect   RESULTS  Summary of this visit's results, reviewed by myself:   EKG Interpretation  Date/Time:  Friday January 14 2019 03:26:44 EDT Ventricular Rate:  73 PR Interval:    QRS Duration: 83 QT Interval:  373 QTC Calculation: 411 R Axis:   95 Text Interpretation:  Sinus rhythm Borderline right axis deviation Borderline T abnormalities, anterior  leads No previous ECGs available Confirmed by Aldin Drees, Monica Hart [phone]) on 01/14/2019 3:33:34 AM      Laboratory Studies: Results for orders placed or performed during the hospital encounter of 01/14/19 (from the past 24 hour(s))  Pregnancy, urine     Status: None   Collection Time: 01/14/19  3:51 AM  Result Value Ref Range   Preg Test, Ur NEGATIVE NEGATIVE  CBC with Differential/Platelet     Status: Abnormal   Collection Time:  01/14/19  3:51 AM  Result Value Ref Range   WBC 8.2 4.0 - 10.5 K/uL   RBC 5.24 (H) 3.87 - 5.11 MIL/uL   Hemoglobin 14.8 12.0 - 15.0 g/dL   HCT 44.9 36.0 - 46.0 %   MCV 85.7 80.0 - 100.0 fL   MCH 28.2 26.0 - 34.0 pg   MCHC 33.0 30.0 - 36.0 g/dL   RDW 13.0 11.5 - 15.5 %   Platelets 222 150 - 400 K/uL   nRBC 0.0 0.0 - 0.2 %   Neutrophils Relative % 55 %   Neutro Abs 4.6 1.7 - 7.7 K/uL   Lymphocytes Relative 30 %   Lymphs Abs 2.5 0.7 - 4.0 K/uL   Monocytes Relative 12 %   Monocytes Absolute 1.0 0.1 - 1.0 K/uL   Eosinophils Relative 2 %   Eosinophils Absolute 0.1 0.0 - 0.5 K/uL   Basophils Relative 1 %   Basophils Absolute 0.0 0.0 - 0.1 K/uL   Immature Granulocytes 0 %   Abs Immature Granulocytes 0.02 0.00 - 0.07 K/uL  Basic metabolic panel     Status: Abnormal   Collection Time: 01/14/19  3:51 AM  Result Value Ref Range   Sodium 138 135 - 145 mmol/L   Potassium 3.6 3.5 - 5.1 mmol/L   Chloride 107 98 - 111 mmol/L   CO2 20 (L) 22 - 32 mmol/L   Glucose, Bld 94 70 - 99 mg/dL   BUN 18 6 - 20 mg/dL   Creatinine, Ser 0.89 0.44 - 1.00 mg/dL   Calcium 9.4 8.9 - 10.3 mg/dL   GFR calc non Af Amer >60 >60 mL/min   GFR calc Af Amer >60 >60 mL/min   Anion gap 11 5 - 15  Troponin I (High Sensitivity)     Status: None   Collection Time: 01/14/19  3:52 AM  Result Value Ref Range   Troponin I (High Sensitivity) <2 <18 ng/L   Imaging Studies: Dg Chest 2 View  Result Date: 01/14/2019 CLINICAL DATA:  Chest pain EXAM: CHEST - 2 VIEW COMPARISON:  None. FINDINGS: Normal heart size and mediastinal contours. No acute infiltrate or edema. No effusion or pneumothorax. No acute osseous findings. IMPRESSION: Negative chest. Electronically Signed   By: Monte Fantasia M.D.   On: 01/14/2019 04:16    ED COURSE and MDM  Nursing notes and initial vitals signs, including pulse oximetry, reviewed.  Vitals:   01/14/19 0314 01/14/19 0318 01/14/19 0430  BP:  114/72 118/79  Pulse:  74 66  Resp:  18 17   Temp:  98.5 F (36.9 C)   TempSrc:  Oral   SpO2:  100% 100%  Weight: 75.8 kg    Height: 5\' 4"  (1.626 m)     5:20 AM Patient's discomfort has improved with IV Toradol.  Symptoms are suggestive of pericarditis but presentation is atypical especially with lack of diffuse ST elevations on chest x-ray.  We will place her on ibuprofen and have her follow-up with cardiology.  She was advised  to return for severe pain, difficulty breathing, syncope or other worsening symptoms.  PROCEDURES    ED DIAGNOSES     ICD-10-CM   1. Acute idiopathic pericarditis  I30.0        Cina Klumpp, Jonny RuizJohn, MD 01/14/19 [phone]

## 2019-01-17 ENCOUNTER — Telehealth: Payer: Self-pay

## 2019-01-17 NOTE — Telephone Encounter (Signed)
NOTES ON FILE FROM ED SENT REFERRAL TO SCHEDULING 

## 2019-01-28 ENCOUNTER — Other Ambulatory Visit: Payer: Self-pay

## 2019-01-31 ENCOUNTER — Ambulatory Visit: Payer: 59 | Admitting: Obstetrics and Gynecology

## 2019-01-31 ENCOUNTER — Other Ambulatory Visit: Payer: Self-pay

## 2019-01-31 ENCOUNTER — Encounter: Payer: Self-pay | Admitting: Obstetrics and Gynecology

## 2019-01-31 VITALS — BP 118/78 | HR 64 | Temp 98.3°F | Ht 64.17 in | Wt 166.0 lb

## 2019-01-31 DIAGNOSIS — Z01419 Encounter for gynecological examination (general) (routine) without abnormal findings: Secondary | ICD-10-CM

## 2019-01-31 NOTE — Patient Instructions (Signed)
EXERCISE AND DIET:  We recommended that you start or continue a regular exercise program for good health. Regular exercise means any activity that makes your heart beat faster and makes you sweat.  We recommend exercising at least 30 minutes per day at least 3 days a week, preferably 4 or 5.  We also recommend a diet low in fat and sugar.  Inactivity, poor dietary choices and obesity can cause diabetes, heart attack, stroke, and kidney damage, among others.    ALCOHOL AND SMOKING:  Women should limit their alcohol intake to no more than 7 drinks/beers/glasses of wine (combined, not each!) per week. Moderation of alcohol intake to this level decreases your risk of breast cancer and liver damage. And of course, no recreational drugs are part of a healthy lifestyle.  And absolutely no smoking or even second hand smoke. Most people know smoking can cause heart and lung diseases, but did you know it also contributes to weakening of your bones? Aging of your skin?  Yellowing of your teeth and nails?  CALCIUM AND VITAMIN D:  Adequate intake of calcium and Vitamin D are recommended.  The recommendations for exact amounts of these supplements seem to change often, but generally speaking 1,000 mg of calcium (between diet and supplement) and 800 units of Vitamin D per day seems prudent. Certain women may benefit from higher intake of Vitamin D.  If you are among these women, your doctor will have told you during your visit.    PAP SMEARS:  Pap smears, to check for cervical cancer or precancers,  have traditionally been done yearly, although recent scientific advances have shown that most women can have pap smears less often.  However, every woman still should have a physical exam from her gynecologist every year. It will include a breast check, inspection of the vulva and vagina to check for abnormal growths or skin changes, a visual exam of the cervix, and then an exam to evaluate the size and shape of the uterus and  ovaries.  And after 28 years of age, a rectal exam is indicated to check for rectal cancers. We will also provide age appropriate advice regarding health maintenance, like when you should have certain vaccines, screening for sexually transmitted diseases, bone density testing, colonoscopy, mammograms, etc.   MAMMOGRAMS:  All women over 40 years old should have a yearly mammogram. Many facilities now offer a "3D" mammogram, which may cost around $50 extra out of pocket. If possible,  we recommend you accept the option to have the 3D mammogram performed.  It both reduces the number of women who will be called back for extra views which then turn out to be normal, and it is better than the routine mammogram at detecting truly abnormal areas.    COLON CANCER SCREENING: Now recommend starting at age 45. At this time colonoscopy is not covered for routine screening until 50. There are take home tests that can be done between 45-49.   COLONOSCOPY:  Colonoscopy to screen for colon cancer is recommended for all women at age 50.  We know, you hate the idea of the prep.  We agree, BUT, having colon cancer and not knowing it is worse!!  Colon cancer so often starts as a polyp that can be seen and removed at colonscopy, which can quite literally save your life!  And if your first colonoscopy is normal and you have no family history of colon cancer, most women don't have to have it again for   10 years.  Once every ten years, you can do something that may end up saving your life, right?  We will be happy to help you get it scheduled when you are ready.  Be sure to check your insurance coverage so you understand how much it will cost.  It may be covered as a preventative service at no cost, but you should check your particular policy.      Breast Self-Awareness Breast self-awareness means being familiar with how your breasts look and feel. It involves checking your breasts regularly and reporting any changes to your  health care provider. Practicing breast self-awareness is important. A change in your breasts can be a sign of a serious medical problem. Being familiar with how your breasts look and feel allows you to find any problems early, when treatment is more likely to be successful. All women should practice breast self-awareness, including women who have had breast implants. How to do a breast self-exam One way to learn what is normal for your breasts and whether your breasts are changing is to do a breast self-exam. To do a breast self-exam: Look for Changes  1. Remove all the clothing above your waist. 2. Stand in front of a mirror in a room with good lighting. 3. Put your hands on your hips. 4. Push your hands firmly downward. 5. Compare your breasts in the mirror. Look for differences between them (asymmetry), such as: ? Differences in shape. ? Differences in size. ? Puckers, dips, and bumps in one breast and not the other. 6. Look at each breast for changes in your skin, such as: ? Redness. ? Scaly areas. 7. Look for changes in your nipples, such as: ? Discharge. ? Bleeding. ? Dimpling. ? Redness. ? A change in position. Feel for Changes Carefully feel your breasts for lumps and changes. It is best to do this while lying on your back on the floor and again while sitting or standing in the shower or tub with soapy water on your skin. Feel each breast in the following way:  Place the arm on the side of the breast you are examining above your head.  Feel your breast with the other hand.  Start in the nipple area and make  inch (2 cm) overlapping circles to feel your breast. Use the pads of your three middle fingers to do this. Apply light pressure, then medium pressure, then firm pressure. The light pressure will allow you to feel the tissue closest to the skin. The medium pressure will allow you to feel the tissue that is a little deeper. The firm pressure will allow you to feel the tissue  close to the ribs.  Continue the overlapping circles, moving downward over the breast until you feel your ribs below your breast.  Move one finger-width toward the center of the body. Continue to use the  inch (2 cm) overlapping circles to feel your breast as you move slowly up toward your collarbone.  Continue the up and down exam using all three pressures until you reach your armpit.  Write Down What You Find  Write down what is normal for each breast and any changes that you find. Keep a written record with breast changes or normal findings for each breast. By writing this information down, you do not need to depend only on memory for size, tenderness, or location. Write down where you are in your menstrual cycle, if you are still menstruating. If you are having trouble noticing differences   in your breasts, do not get discouraged. With time you will become more familiar with the variations in your breasts and more comfortable with the exam. How often should I examine my breasts? Examine your breasts every month. If you are breastfeeding, the best time to examine your breasts is after a feeding or after using a breast pump. If you menstruate, the best time to examine your breasts is 5-7 days after your period is over. During your period, your breasts are lumpier, and it may be more difficult to notice changes. When should I see my health care provider? See your health care provider if you notice:  A change in shape or size of your breasts or nipples.  A change in the skin of your breast or nipples, such as a reddened or scaly area.  Unusual discharge from your nipples.  A lump or thick area that was not there before.  Pain in your breasts.  Anything that concerns you.  

## 2019-01-31 NOTE — Progress Notes (Signed)
28 y.o. [id number] Married White or Caucasian Not Hispanic or Latino female here for annual exam.  She had her Rutha Bouchardkyleena IUD pulled in 5/20 and her husband had a vasectomy.  No dyspareunia.  She was evaluated in the ER earlier this month with acute idiopathic pericarditis.   Period Cycle (Days): 28 Period Duration (Days): 5-6 days Period Pattern: Regular Menstrual Flow: Light Menstrual Control: Thin pad Menstrual Control Change Freq (Hours): changes pad every 4-5 hours Dysmenorrhea: None  Patient's last menstrual period was 01/22/2019 (exact date).          Sexually active: Yes.    The current method of family planning is spouse with vasectomy.    Exercising: Yes.    yoga, HIIT Smoker:  no  Health Maintenance: Pap:  03/02/2017 WNL History of abnormal Pap:  no MMG:  n/a Colonoscopy:  n/a BMD:   n/a TDaP:  2018 per patient Gardasil: no   reports that she has never smoked. She has never used smokeless tobacco. She reports that she does not drink alcohol or use drugs. Left graduate school, didn't like it. Volunteering at the [employer] (would like to work there). She signing up for on line biology, conservation, 3 years Masters.    Past Medical History:  Diagnosis Date  . Amenorrhea   . Anemia   . Anorexia   . Anxiety   . Depression   . Dysmenorrhea   . Dyspareunia, female   . Heart murmur   . Pericarditis     Past Surgical History:  Procedure Laterality Date  . DILATION AND CURETTAGE OF UTERUS      Current Outpatient Medications  Medication Sig Dispense Refill  . buPROPion (WELLBUTRIN XL) 150 MG 24 hr tablet Take 1 tablet by mouth daily.    Marland Kitchen. escitalopram (LEXAPRO) 5 MG tablet Take 1 tablet by mouth daily.    Marland Kitchen. ibuprofen (ADVIL) 600 MG tablet Take 1 tablet (600 mg total) by mouth every 8 (eight) hours. 42 tablet 0  . Multiple Vitamin (MULTIVITAMIN) tablet Take 1 tablet by mouth daily.    Marland Kitchen. omeprazole (PRILOSEC) 20 MG capsule Take 1 capsule daily while taking  ibuprofen. 30 capsule 0   No current facility-administered medications for this visit.     Family History  Problem Relation Age of Onset  . Alcoholism Maternal Grandmother   . Alcoholism Maternal Grandfather     Review of Systems  Constitutional: Negative.   HENT: Negative.   Eyes: Negative.   Respiratory: Negative.   Cardiovascular: Negative.   Gastrointestinal: Negative.   Endocrine: Negative.   Genitourinary: Negative.   Musculoskeletal: Negative.   Skin: Negative.   Allergic/Immunologic: Negative.   Neurological: Negative.   Psychiatric/Behavioral: Negative.     Exam:   BP 118/78 (BP Location: Right Arm, Patient Position: Sitting, Cuff Size: Normal)   Pulse 64   Temp 98.3 F (36.8 C) (Skin)   Ht 5' 4.17" (1.63 m)   Wt 166 lb (75.3 kg)   LMP 01/22/2019 (Exact Date)   BMI 28.34 kg/m   Weight change: @WEIGHTCHANGE @ Height:   Height: 5' 4.17" (163 cm)  Ht Readings from Last 3 Encounters:  01/31/19 5' 4.17" (1.63 m)  01/14/19 5\' 4"  (1.626 m)  11/13/17 5\' 4"  (1.626 m)    General appearance: alert, cooperative and appears stated age Head: Normocephalic, without obvious abnormality, atraumatic Neck: no adenopathy, supple, symmetrical, trachea midline and thyroid normal to inspection and palpation Lungs: clear to auscultation bilaterally Cardiovascular: regular rate and rhythm Breasts:  normal appearance, no masses or tenderness Abdomen: soft, non-tender; non distended,  no masses,  no organomegaly Extremities: extremities normal, atraumatic, no cyanosis or edema Skin: Skin color, texture, turgor normal. No rashes or lesions Lymph nodes: Cervical, supraclavicular, and axillary nodes normal. No abnormal inguinal nodes palpated Neurologic: Grossly normal   Pelvic: External genitalia:  no lesions              Urethra:  normal appearing urethra with no masses, tenderness or lesions              Bartholins and Skenes: normal                 Vagina: normal appearing  vagina with normal color and discharge, no lesions              Cervix: no lesions               Bimanual Exam:  Uterus:  normal size, contour, position, consistency, mobility, non-tender              Adnexa: no mass, fullness, tenderness               Rectovaginal: Confirms               Anus:  normal sphincter tone, no lesions  Chaperone was present for exam.  A:  Well Woman with normal exam  P:   Pap next year  Labs UTD  Discussed breast self exam  Discussed calcium and vit D intake

## 2019-01-31 NOTE — Progress Notes (Signed)
Cardiology Office Note:   Date:  02/01/2019  NAME:  Monica Hart    MRN: [id number] DOB:  06-21-1990   PCP:  System, Pcp Not In  Cardiologist:  Reatha HarpsWesley T O'Neal, MD  Electrophysiologist:  None   Referring MD: Paula LibraMolpus, John, MD   Chief Complaint  Patient presents with  . Chest Pain   History of Present Illness:   Monica Hart is a 28 y.o. female without significant past medical history who is being seen today for the evaluation of chest pain at the request of Paula LibraJohn Molpus, MD. Monica Hart reports she was seen in emergency room on August 7 around 3 AM for the sudden onset of chest pressure.  She reports she was awoken from sleep with right arm pain, as well as pressure in her chest but then became short was worsened with laying flat, and alleviated by leaning forward.  She reports no associated shortness of breath with this pain.  She was evaluated emergency room, and ruled out for myocardial infarction.  She was started on a course of ibuprofen for potential pericarditis, although her ECG did not have classic diffuse PR depressions.  She reports 2 to 3 days prior she began a high intensity workout that involved pull-ups, up downs, crossfit like exercises.  She states she is continued to do these exercises and has noticed a slight twinge in her chest that lasts seconds but has had no further recurrence of these episodes.  She is remained on 60 mg ibuprofen twice daily since that time, as well as omeprazole.  She has no prior history of heart disease Docucal.  She does have a history of anxiety depression that is well controlled.  She is married and has been married for 5 years and describes no difficulties in home.  Past Medical History: Past Medical History:  Diagnosis Date  . Amenorrhea   . Anemia   . Anorexia   . Anxiety   . Depression   . Dysmenorrhea   . Dyspareunia, female   . Heart murmur   . Pericarditis     Past Surgical History: Past Surgical History:  Procedure Laterality Date   . DILATION AND CURETTAGE OF UTERUS      Current Medications: Current Meds  Medication Sig  . buPROPion (WELLBUTRIN XL) 150 MG 24 hr tablet Take 1 tablet by mouth daily.  Marland Kitchen. escitalopram (LEXAPRO) 5 MG tablet Take 1 tablet by mouth daily.  . Multiple Vitamin (MULTIVITAMIN) tablet Take 1 tablet by mouth daily.  . [DISCONTINUED] ibuprofen (ADVIL) 600 MG tablet Take 1 tablet (600 mg total) by mouth every 8 (eight) hours.  . [DISCONTINUED] omeprazole (PRILOSEC) 20 MG capsule Take 1 capsule daily while taking ibuprofen.     Allergies:    Patient has no known allergies.   Social History: Social History   Socioeconomic History  . Marital status: Married    Spouse name: Not on file  . Number of children: Not on file  . Years of education: Not on file  . Highest education level: Not on file  Occupational History  . Not on file  Social Needs  . Financial resource strain: Not on file  . Food insecurity    Worry: Not on file    Inability: Not on file  . Transportation needs    Medical: Not on file    Non-medical: Not on file  Tobacco Use  . Smoking status: Never Smoker  . Smokeless tobacco: Never Used  Substance and Sexual Activity  .  Alcohol use: No  . Drug use: No  . Sexual activity: Yes    Partners: Male    Birth control/protection: Surgical    Comment: spouse with vasectomy  Lifestyle  . Physical activity    Days per week: Not on file    Minutes per session: Not on file  . Stress: Not on file  Relationships  . Social Musicianconnections    Talks on phone: Not on file    Gets together: Not on file    Attends religious service: Not on file    Active member of club or organization: Not on file    Attends meetings of clubs or organizations: Not on file    Relationship status: Not on file  Other Topics Concern  . Not on file  Social History Narrative  . Not on file     Family History: The patient's family history includes Alcoholism in her maternal grandfather and maternal  grandmother.  ROS:   All other ROS reviewed and negative. Pertinent positives noted in the HPI.     EKGs/Labs/Other Studies Reviewed:   The following studies were personally reviewed by me today: Lab data from the emergency room visit reviewed today which shows normal troponin, normal white count, hemoglobin 14.8, platelets 222.  EKG during that visit was normal sinus rhythm without acute ST-T changes or prior infarction, grossly normal  EKG:  EKG is ordered today.  The ekg ordered today demonstrates normal sinus rhythm, heart rate 62, normal intervals, no acute abnormality, and was personally reviewed by me.   Recent Labs: 01/14/2019: BUN 18; Creatinine, Ser 0.89; Hemoglobin 14.8; Platelets 222; Potassium 3.6; Sodium 138   Recent Lipid Panel    Component Value Date/Time   CHOL 181 03/02/2017 1150   TRIG 50 03/02/2017 1150   HDL 85 03/02/2017 1150   CHOLHDL 2.1 03/02/2017 1150   LDLCALC 86 03/02/2017 1150    Physical Exam:   VS:  BP 110/68 (BP Location: Left Arm, Patient Position: Sitting, Cuff Size: Normal)   Pulse 68   Temp 97.7 F (36.5 C)   Ht 5\' 4"  (1.626 m)   Wt 167 lb (75.8 kg)   LMP 01/22/2019 (Exact Date)   BMI 28.67 kg/m    Wt Readings from Last 3 Encounters:  02/01/19 167 lb (75.8 kg)  01/31/19 166 lb (75.3 kg)  01/14/19 167 lb (75.8 kg)    General: Well nourished, well developed, in no acute distress Heart: Atraumatic, normal size  Eyes: PEERLA, EOMI  Neck: Supple, no JVD Endocrine: No thryomegaly Cardiac: Normal S1, S2; RRR; no murmurs, rubs, or gallops Lungs: Clear to auscultation bilaterally, no wheezing, rhonchi or rales  Abd: Soft, nontender, no hepatomegaly  Ext: No edema, pulses 2+ Musculoskeletal: No deformities, BUE and BLE strength normal and equal Skin: Warm and dry, no rashes   Neuro: Alert and oriented to person, place, time, and situation, CNII-XII grossly intact, no focal deficits  Psych: Normal mood and affect   ASSESSMENT:   NAME@ is a  28 y.o. female who presents for the following: 1. Idiopathic pericarditis, unspecified chronicity     PLAN:   1. Idiopathic pericarditis, unspecified chronicity -Unclear if this was actually pericarditis, acute muscle strain, versus costochondritis.  Nonetheless, she has been treated appropriately with a course of NSAIDs.  Her EKG demonstrates no diffuse PR depression and she has no evidence of pericardial rub on exam.  Her symptoms have resolved and are much better.  I have instructed her that the likelihood  of her having heart disease is extremely low given that she is so healthy and active.  For now, we will monitor her clinically and she will let me know if she has further symptoms that are concerning to her.  But, at this time I see no need for further testing or medications.  I will stop her ibuprofen and omeprazole.  She will follow-up with [place] as needed.   Disposition: Return if symptoms worsen or fail to improve.  Medication Adjustments/Labs and Tests Ordered: Current medicines are reviewed at length with the patient today.  Concerns regarding medicines are outlined above.  Orders Placed This Encounter  Procedures  . EKG 12-Lead   No orders of the defined types were placed in this encounter.   Patient Instructions  Medication Instructions:  -stop taking ibuprofen and omperazole  Lab work:  Not needed  Testing/Procedures: Not needed  Follow-Up: At [facility], you and your health needs are our priority.  As part of our continuing mission to provide you with exceptional heart care, we have created designated Provider Care Teams.  These Care Teams include your primary Cardiologist (physician) and Advanced Practice Providers (APPs -  Physician Assistants and Nurse Practitioners) who all work together to provide you with the care you need, when you need it. . Dr Audie Box recommends that you schedule a follow-up appointment on as needed basis     Any Other Special Instructions  Will Be Listed Below (If Applicable).    Signed, Addison Naegeli. Audie Box, [place]  [address] [place],  [address] [phone]  02/01/2019 11:01 AM

## 2019-02-01 ENCOUNTER — Encounter: Payer: Self-pay | Admitting: Cardiovascular Disease

## 2019-02-01 ENCOUNTER — Ambulatory Visit (INDEPENDENT_AMBULATORY_CARE_PROVIDER_SITE_OTHER): Payer: 59 | Admitting: Cardiovascular Disease

## 2019-02-01 VITALS — BP 110/68 | HR 68 | Temp 97.7°F | Ht 64.0 in | Wt 167.0 lb

## 2019-02-01 DIAGNOSIS — I300 Acute nonspecific idiopathic pericarditis: Secondary | ICD-10-CM | POA: Diagnosis not present

## 2019-02-01 NOTE — Patient Instructions (Addendum)
Medication Instructions:  -stop taking ibuprofen and omperazole  Lab work:  Not needed  Testing/Procedures: Not needed  Follow-Up: At [facility], you and your health needs are our priority.  As part of our continuing mission to provide you with exceptional heart care, we have created designated Provider Care Teams.  These Care Teams include your primary Cardiologist (physician) and Advanced Practice Providers (APPs -  Physician Assistants and Nurse Practitioners) who all work together to provide you with the care you need, when you need it. . Dr Audie Box recommends that you schedule a follow-up appointment on as needed basis     Any Other Special Instructions Will Be Listed Below (If Applicable).

## 2019-08-22 ENCOUNTER — Encounter: Payer: Self-pay | Admitting: Certified Nurse Midwife

## 2019-08-24 ENCOUNTER — Encounter: Payer: Self-pay | Admitting: Certified Nurse Midwife

## 2020-03-06 NOTE — Progress Notes (Signed)
29 y.o. G52P0010 Married White or Caucasian Not Hispanic or Latino female here for annual exam.   Period Cycle (Days): 28 Period Duration (Days): 4-5 Period Pattern: Regular Menstrual Flow: Light Menstrual Control: Other (Comment) (menstrual cup) Dysmenorrhea: (!) Mild Dysmenorrhea Symptoms: Cramping, Other (Comment) (Back pain)   She has been having some issues with constipation in the last 6 months. BM every other day, needing to strain.   Patient's last menstrual period was 02/15/2020 (approximate).          Sexually active: Yes.    The current method of family planning is vasectomy.    Exercising: Yes.    Yoga x5 days and strength training  Smoker:  no  Health Maintenance: Pap:  03/02/17 WNL  History of abnormal Pap:  no MMG:  NA BMD:   NA Colonoscopy: NA TDaP:  2018 per patient  Gardasil: No, declines.     reports that she has never smoked. She has never used smokeless tobacco. She reports that she does not drink alcohol and does not use drugs. Working in [employer] at [employer]. She is getting her masters in biology, wants to work in Banker.   Past Medical History:  Diagnosis Date  . Amenorrhea   . Anemia   . Anorexia   . Anxiety   . Depression   . Dysmenorrhea   . Dyspareunia, female   . Heart murmur   . Pericarditis     Past Surgical History:  Procedure Laterality Date  . DILATION AND CURETTAGE OF UTERUS      Current Outpatient Medications  Medication Sig Dispense Refill  . Multiple Vitamin (MULTIVITAMIN) tablet Take 1 tablet by mouth daily.     No current facility-administered medications for this visit.    Family History  Problem Relation Age of Onset  . Alcoholism Maternal Grandmother   . Alcoholism Maternal Grandfather     Review of Systems  All other systems reviewed and are negative.   Exam:   BP 112/72   Pulse 71   Ht 5\' 4"  (1.626 m)   Wt 149 lb 12.8 oz (67.9 kg)   LMP 02/15/2020 (Approximate)   SpO2 100%   BMI 25.71 kg/m    Weight change: @WEIGHTCHANGE @ Height:   Height: 5\' 4"  (162.6 cm)  Ht Readings from Last 3 Encounters:  03/07/20 5\' 4"  (1.626 m)  02/01/19 5\' 4"  (1.626 m)  01/31/19 5' 4.17" (1.63 m)    General appearance: alert, cooperative and appears stated age Head: Normocephalic, without obvious abnormality, atraumatic Neck: no adenopathy, supple, symmetrical, trachea midline and thyroid normal to inspection and palpation Lungs: clear to auscultation bilaterally Cardiovascular: regular rate and rhythm Breasts: normal appearance, no masses or tenderness Abdomen: soft, non-tender; non distended,  no masses,  no organomegaly Extremities: extremities normal, atraumatic, no cyanosis or edema Skin: Skin color, texture, turgor normal. No rashes or lesions Lymph nodes: Cervical, supraclavicular, and axillary nodes normal. No abnormal inguinal nodes palpated Neurologic: Grossly normal   Pelvic: External genitalia:  no lesions              Urethra:  normal appearing urethra with no masses, tenderness or lesions              Bartholins and Skenes: normal                 Vagina: normal appearing vagina with normal color and discharge, no lesions              Cervix: no  lesions               Bimanual Exam:  Uterus:  normal size, contour, position, consistency, mobility, non-tender and anteverted              Adnexa: no mass, fullness, tenderness               Rectovaginal: Confirms               Anus:  normal sphincter tone, no lesions  Zenovia Jordan chaperoned for the exam.  A:  Well Woman with normal exam  Constipation  P:   Pap with reflex hpv  Discussed breast self exam  Discussed calcium and vit D intake  Screening labs  Discussed consitpation, information given

## 2020-03-07 ENCOUNTER — Other Ambulatory Visit (HOSPITAL_COMMUNITY)
Admission: RE | Admit: 2020-03-07 | Discharge: 2020-03-07 | Disposition: A | Discharge status: Home / Self Care | Payer: 59 | Source: Ambulatory Visit | Attending: Obstetrics and Gynecology | Admitting: Obstetrics and Gynecology

## 2020-03-07 ENCOUNTER — Ambulatory Visit (INDEPENDENT_AMBULATORY_CARE_PROVIDER_SITE_OTHER): Payer: 59 | Admitting: Obstetrics and Gynecology

## 2020-03-07 ENCOUNTER — Other Ambulatory Visit: Payer: Self-pay

## 2020-03-07 ENCOUNTER — Encounter: Payer: Self-pay | Admitting: Obstetrics and Gynecology

## 2020-03-07 VITALS — BP 112/72 | HR 71 | Ht 64.0 in | Wt 149.8 lb

## 2020-03-07 DIAGNOSIS — Z124 Encounter for screening for malignant neoplasm of cervix: Secondary | ICD-10-CM

## 2020-03-07 DIAGNOSIS — Z0000 Encounter for general adult medical examination without abnormal findings: Secondary | ICD-10-CM | POA: Diagnosis not present

## 2020-03-07 DIAGNOSIS — Z01419 Encounter for gynecological examination (general) (routine) without abnormal findings: Secondary | ICD-10-CM | POA: Diagnosis not present

## 2020-03-07 NOTE — Patient Instructions (Addendum)
You can take over the counter magnesium up to 500 mg a day for constipation.  About Constipation  Constipation Overview Constipation is the most common gastrointestinal complaint -- about 4 million Americans experience constipation and make 2.5 million physician visits a year to get help for the problem.  Constipation can occur when the colon absorbs too much water, the colon's muscle contraction is slow or sluggish, and/or there is delayed transit time through the colon.  The result is stool that is hard and dry.  Indicators of constipation include straining during bowel movements greater than 25% of the time, having fewer than three bowel movements per week, and/or the feeling of incomplete evacuation.  There are established guidelines ([place] II ) for defining constipation. A person needs to have two or more of the following symptoms for at least 12 weeks (not necessarily consecutive) in the preceding 12 months: . Straining in  greater than 25% of bowel movements . Lumpy or hard stools in greater than 25% of bowel movements . Sensation of incomplete emptying in greater than 25% of bowel movements . Sensation of anorectal obstruction/blockade in greater than 25% of bowel movements . Manual maneuvers to help empty greater than 25% of bowel movements (e.g., digital evacuation, support of the pelvic floor)  . Less than  3 bowel movements/week . Loose stools are not present, and criteria for irritable bowel syndrome are insufficient  Common Causes of Constipation . Lack of fiber in your diet . Lack of physical activity . Medications, including iron and calcium supplements  . Dairy intake . Dehydration . Abuse of laxatives  Travel  Irritable Bowel Syndrome  Pregnancy  Luteal phase of menstruation (after ovulation and before menses)  Colorectal problems  Intestinal Dysfunction  Treating Constipation  There are several ways of treating constipation, including changes to diet and  exercise, use of laxatives, adjustments to the pelvic floor, and scheduled toileting.  These treatments include: . increasing fiber and fluids in the diet  . increasing physical activity . learning muscle coordination   learning proper toileting techniques and toileting modifications   designing and sticking  to a toileting schedule     2007, [employer] Doc.22

## 2020-03-08 LAB — COMPREHENSIVE METABOLIC PANEL
ALT: 13 IU/L (ref 0–32)
AST: 15 IU/L (ref 0–40)
Albumin/Globulin Ratio: 2.2 (ref 1.2–2.2)
Albumin: 4.7 g/dL (ref 3.9–5.0)
Alkaline Phosphatase: 65 IU/L (ref 44–121)
BUN/Creatinine Ratio: 18 (ref 9–23)
BUN: 15 mg/dL (ref 6–20)
Bilirubin Total: <0.2 mg/dL (ref 0.0–1.2)
CO2: 22 mmol/L (ref 20–29)
Calcium: 9 mg/dL (ref 8.7–10.2)
Chloride: 105 mmol/L (ref 96–106)
Creatinine, Ser: 0.83 mg/dL (ref 0.57–1.00)
GFR calc Af Amer: 110 mL/min/{1.73_m2} (ref >59)
GFR calc non Af Amer: 96 mL/min/{1.73_m2} (ref >59)
Globulin, Total: 2.1 g/dL (ref 1.5–4.5)
Glucose: 84 mg/dL (ref 65–99)
Potassium: 4 mmol/L (ref 3.5–5.2)
Sodium: 140 mmol/L (ref 134–144)
Total Protein: 6.8 g/dL (ref 6.0–8.5)

## 2020-03-08 LAB — CBC
Hematocrit: 40.6 % (ref 34.0–46.6)
Hemoglobin: 13.7 g/dL (ref 11.1–15.9)
MCH: 29.1 pg (ref 26.6–33.0)
MCHC: 33.7 g/dL (ref 31.5–35.7)
MCV: 86 fL (ref 79–97)
Platelets: 211 10*3/uL (ref 150–450)
RBC: 4.7 x10E6/uL (ref 3.77–5.28)
RDW: 12.2 % (ref 11.7–15.4)
WBC: 7 10*3/uL (ref 3.4–10.8)

## 2020-03-08 LAB — LIPID PANEL
Chol/HDL Ratio: 3.2 ratio (ref 0.0–4.4)
Cholesterol, Total: 175 mg/dL (ref 100–199)
HDL: 54 mg/dL (ref >39)
LDL Chol Calc (NIH): 96 mg/dL (ref 0–99)
Triglycerides: 143 mg/dL (ref 0–149)
VLDL Cholesterol Cal: 25 mg/dL (ref 5–40)

## 2020-03-09 LAB — CYTOLOGY - PAP
Comment: NEGATIVE
Diagnosis: NEGATIVE
High risk HPV: NEGATIVE

## 2021-06-30 IMAGING — DX CHEST - 2 VIEW
2 series · 2 of 2 positions shown · non-contrast
Comparison: None.

CLINICAL DATA: Chest pain

EXAM:
CHEST - 2 VIEW

[chest pa]
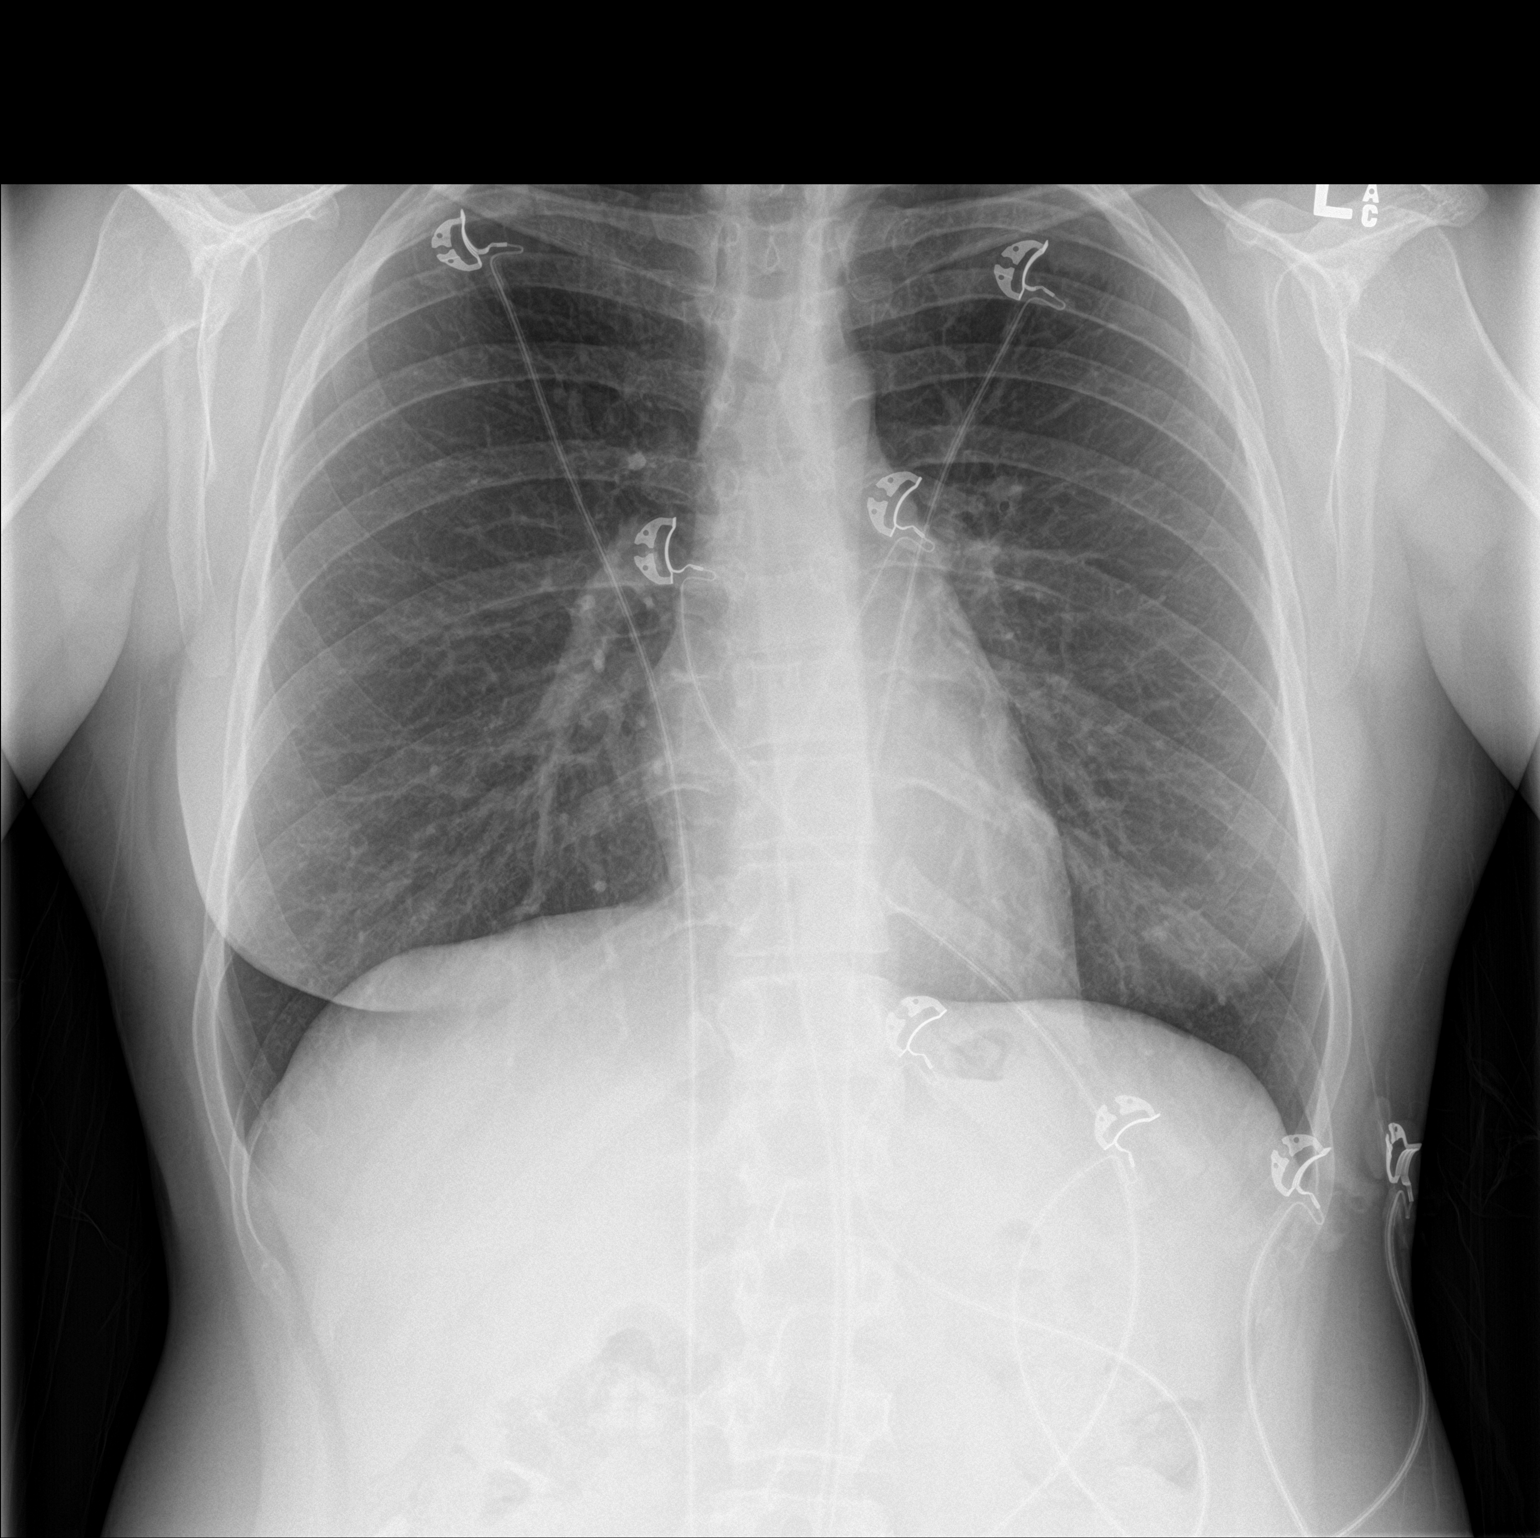

[chest lat]
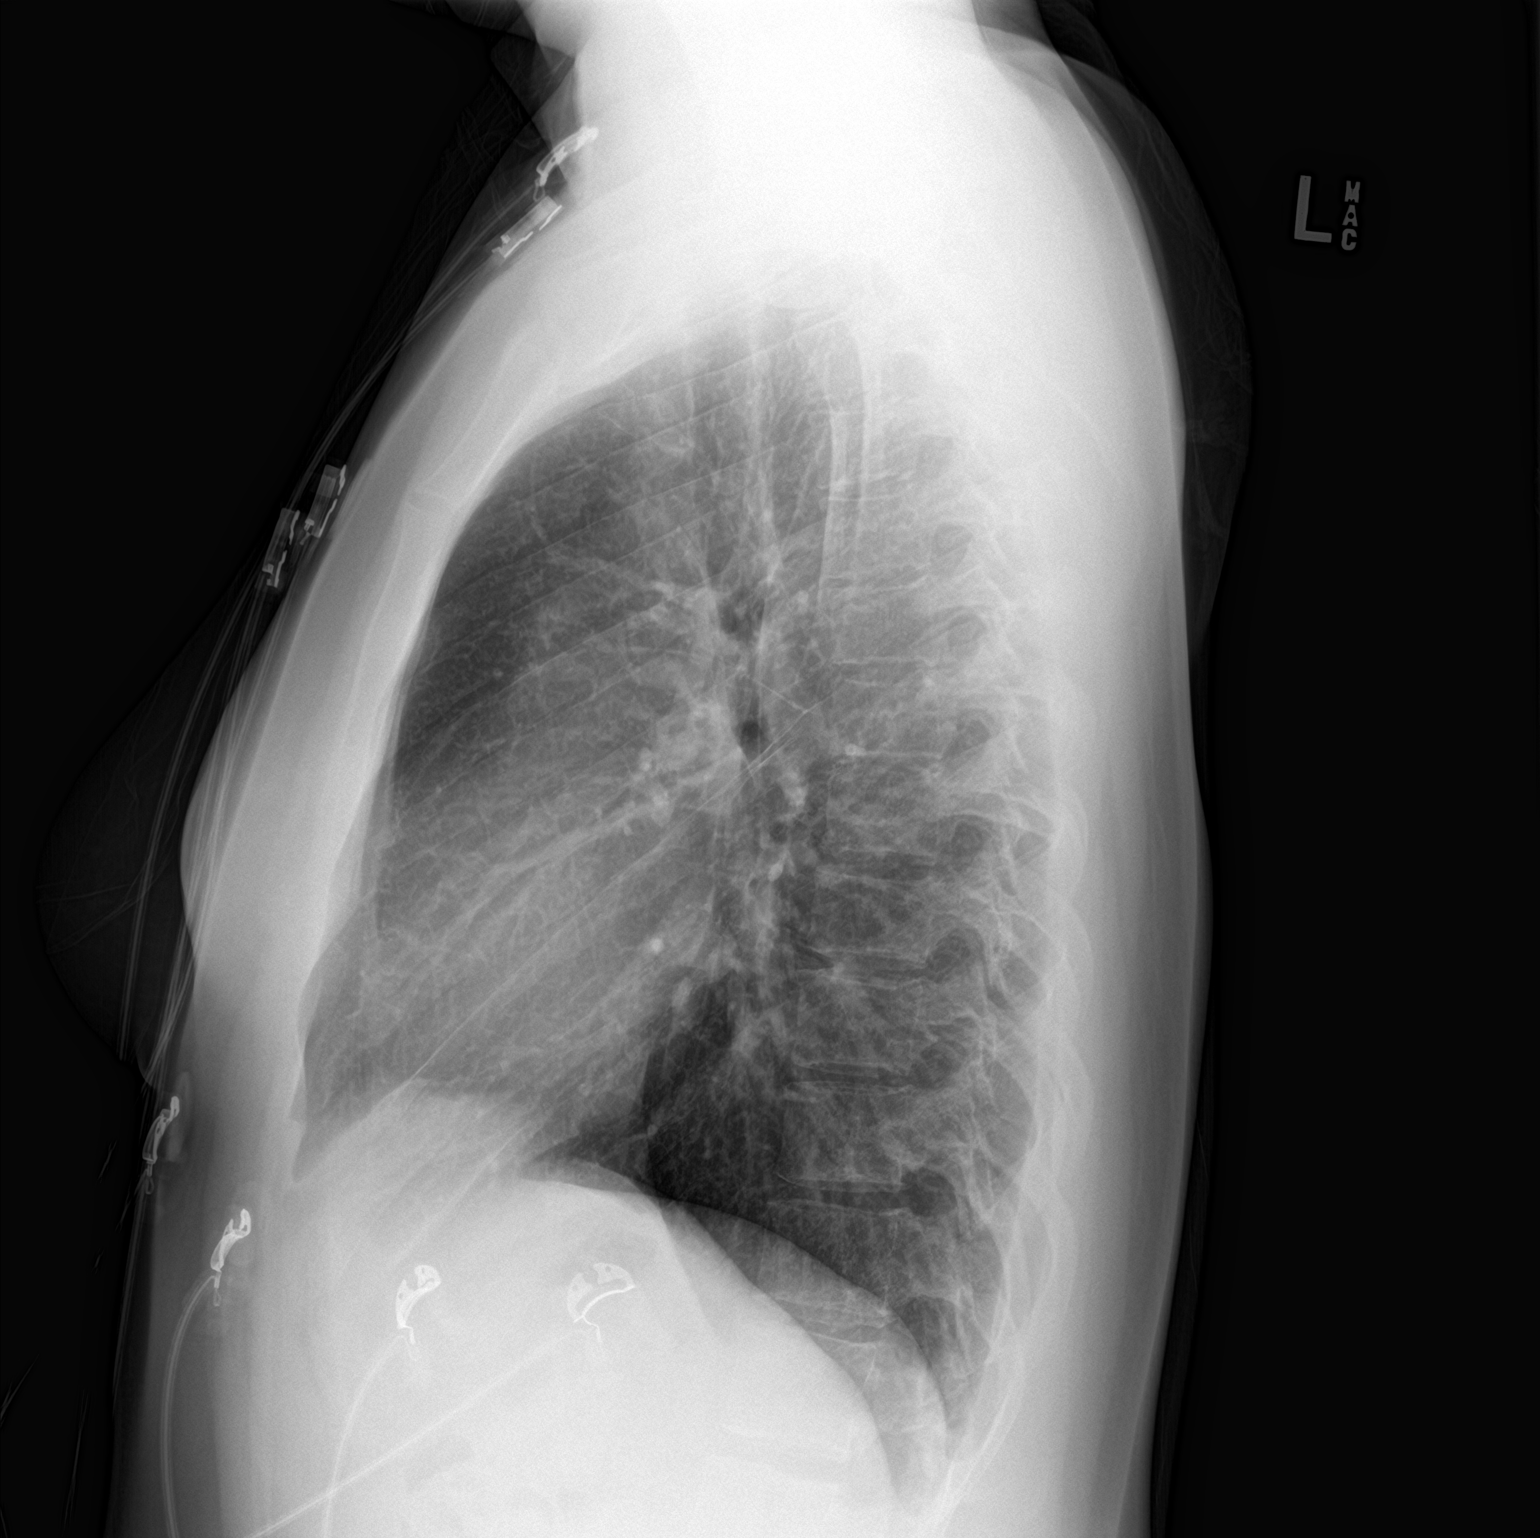

[2 of 2 positions shown; findings below may reference images not displayed]

FINDINGS: Normal heart size and mediastinal contours. No acute infiltrate or
edema. No effusion or pneumothorax. No acute osseous findings.
IMPRESSION: Negative chest.
